# Patient Record
Sex: Female | Born: 1970 | Race: White | Hispanic: No | Smoking: Never smoker
Health system: Southern US, Community
[De-identification: ages and names within clinical notes are randomized; demographics above are authoritative.]

## PROBLEM LIST (undated history)

## (undated) DIAGNOSIS — T7840XA Allergy, unspecified, initial encounter: Secondary | ICD-10-CM

## (undated) DIAGNOSIS — F419 Anxiety disorder, unspecified: Secondary | ICD-10-CM

## (undated) DIAGNOSIS — E786 Lipoprotein deficiency: Secondary | ICD-10-CM

## (undated) DIAGNOSIS — L709 Acne, unspecified: Secondary | ICD-10-CM

## (undated) DIAGNOSIS — N200 Calculus of kidney: Secondary | ICD-10-CM

## (undated) DIAGNOSIS — E669 Obesity, unspecified: Secondary | ICD-10-CM

## (undated) DIAGNOSIS — N1330 Unspecified hydronephrosis: Secondary | ICD-10-CM

## (undated) HISTORY — DX: Calculus of kidney: N20.0

## (undated) HISTORY — DX: Unspecified hydronephrosis: N13.30

## (undated) HISTORY — DX: Acne, unspecified: L70.9

## (undated) HISTORY — DX: Lipoprotein deficiency: E78.6

## (undated) HISTORY — DX: Allergy, unspecified, initial encounter: T78.40XA

## (undated) HISTORY — DX: Obesity, unspecified: E66.9

## (undated) HISTORY — DX: Anxiety disorder, unspecified: F41.9

---

## 1998-12-06 ENCOUNTER — Other Ambulatory Visit: Admission: RE | Admit: 1998-12-06 | Discharge: 1998-12-06 | Payer: Self-pay | Admitting: Obstetrics and Gynecology

## 1999-05-30 ENCOUNTER — Emergency Department (HOSPITAL_COMMUNITY): Admission: EM | Admit: 1999-05-30 | Discharge: 1999-05-30 | Payer: Self-pay | Admitting: Emergency Medicine

## 1999-09-08 ENCOUNTER — Emergency Department (HOSPITAL_COMMUNITY): Admission: EM | Admit: 1999-09-08 | Discharge: 1999-09-08 | Payer: Self-pay | Admitting: Emergency Medicine

## 2000-02-27 ENCOUNTER — Other Ambulatory Visit: Admission: RE | Admit: 2000-02-27 | Discharge: 2000-02-27 | Payer: Self-pay | Admitting: Obstetrics and Gynecology

## 2001-04-23 ENCOUNTER — Other Ambulatory Visit: Admission: RE | Admit: 2001-04-23 | Discharge: 2001-04-23 | Payer: Self-pay | Admitting: Obstetrics and Gynecology

## 2002-04-08 ENCOUNTER — Other Ambulatory Visit: Admission: RE | Admit: 2002-04-08 | Discharge: 2002-04-08 | Payer: Self-pay | Admitting: Obstetrics and Gynecology

## 2002-06-29 ENCOUNTER — Encounter: Payer: Self-pay | Admitting: Obstetrics and Gynecology

## 2002-06-29 ENCOUNTER — Ambulatory Visit (HOSPITAL_COMMUNITY): Admission: RE | Admit: 2002-06-29 | Discharge: 2002-06-29 | Payer: Self-pay | Admitting: Obstetrics and Gynecology

## 2002-11-18 ENCOUNTER — Inpatient Hospital Stay (HOSPITAL_COMMUNITY): Admission: AD | Admit: 2002-11-18 | Discharge: 2002-11-22 | Payer: Self-pay | Admitting: Obstetrics and Gynecology

## 2002-11-24 ENCOUNTER — Inpatient Hospital Stay (HOSPITAL_COMMUNITY): Admission: AD | Admit: 2002-11-24 | Discharge: 2002-11-24 | Payer: Self-pay | Admitting: Obstetrics and Gynecology

## 2005-05-07 ENCOUNTER — Other Ambulatory Visit: Admission: RE | Admit: 2005-05-07 | Discharge: 2005-05-07 | Payer: Self-pay | Admitting: Family Medicine

## 2006-03-17 ENCOUNTER — Ambulatory Visit: Payer: Self-pay | Admitting: Family Medicine

## 2006-03-27 ENCOUNTER — Ambulatory Visit: Payer: Self-pay | Admitting: Family Medicine

## 2006-11-12 ENCOUNTER — Ambulatory Visit: Payer: Self-pay | Admitting: Family Medicine

## 2006-11-18 ENCOUNTER — Ambulatory Visit: Payer: Self-pay | Admitting: Family Medicine

## 2007-01-07 ENCOUNTER — Other Ambulatory Visit: Admission: RE | Admit: 2007-01-07 | Discharge: 2007-01-07 | Payer: Self-pay | Admitting: Family Medicine

## 2007-01-07 ENCOUNTER — Ambulatory Visit: Payer: Self-pay | Admitting: Family Medicine

## 2008-01-25 ENCOUNTER — Ambulatory Visit: Payer: Self-pay | Admitting: Family Medicine

## 2008-08-08 ENCOUNTER — Ambulatory Visit: Payer: Self-pay | Admitting: Family Medicine

## 2008-10-17 ENCOUNTER — Ambulatory Visit: Payer: Self-pay | Admitting: Family Medicine

## 2009-11-08 ENCOUNTER — Ambulatory Visit: Payer: Self-pay | Admitting: Physician Assistant

## 2010-09-16 HISTORY — PX: LITHOTRIPSY: SUR834

## 2010-10-05 DIAGNOSIS — N1330 Unspecified hydronephrosis: Secondary | ICD-10-CM

## 2010-10-05 HISTORY — DX: Unspecified hydronephrosis: N13.30

## 2010-10-15 ENCOUNTER — Ambulatory Visit (HOSPITAL_COMMUNITY): Admission: RE | Admit: 2010-10-15 | Payer: BC Managed Care – PPO | Source: Ambulatory Visit | Admitting: Urology

## 2010-11-02 NOTE — Op Note (Signed)
NAME:  Denise Fisher, Denise Fisher                         ACCOUNT NO.:  [id number]   MEDICAL RECORD NO.:  [id number]                   PATIENT TYPE:  INP   LOCATION:  9118                                 FACILITY:  [facility]   PHYSICIAN:  Hal Morales, M.D.             DATE OF BIRTH:  December 25, 1970   DATE OF PROCEDURE:  11/19/2002  DATE OF DISCHARGE:                                 OPERATIVE REPORT   PREOPERATIVE DIAGNOSES:  1. Intrauterine pregnancy at term.  2. Blood pressure elevation.  3. Failure to progress in labor.   POSTOPERATIVE DIAGNOSES:  1. Intrauterine pregnancy at term.  2. Blood pressure elevation.  3. Failure to progress in labor.  4. Large for gestational age baby.   OPERATION:  Primary low transverse cesarean section.   SURGEON:  Hal Morales, M.D.   FIRST ASSISTANT:  Renaldo Reel. Emilee Hero, C.N.M.   ANESTHESIA:  Epidural.   ESTIMATED BLOOD LOSS:  750 mL.   COMPLICATIONS:  None.   FINDINGS:  The patient was delivered of a female infant whose name is Aiden,  weighing 10 pounds, with Apgars of 8 and 9 at one and five minutes,  respectively.  The uterus, tubes, and ovaries were normal for the gravid  state.   DESCRIPTION OF PROCEDURE:  The patient was taken to the operating room after  a discussion had been held with her and her husband concerning the  indications for her procedure and the risks involved.  The patient had  failed to progress with adequate labor over several hours beyond 5 cm.  The  risks of anesthesia, bleeding, infection, and damage to adjacent organs were  all explained and the patient and husband consented to proceed.  She was  taken to the operating room and placed on the operating table.  Her labor  epidural was dosed.  Her Foley catheter was already in placed.  The abdomen  was prepped with multiple layers of Betadine and draped as a sterile field.  After assurance of an adequate anesthesia, the suprapubic region was  infiltrated with 10 mL of  0.25% Marcaine.  A suprapubic incision was made  and the abdomen opened in layers.  Peritoneum was entered and the bladder  blade placed.  The uterus was incised approximately 2 cm above the  uterovesical fold and the incision taken laterally on either side bluntly.  The infant was delivered from the occiput transverse position and after  having the nares and pharynx suctioned with DeLee suction and the cord  clamped and cut, was handed off to the awaiting pediatricians.  The  appropriate cord blood was drawn and the placenta noted to have separated  from the uterus and was removed from the operative field.  The uterine  incision was closed with a running interlocking suture of 0 Vicryl.  An  imbricating suture of 0 Vicryl was placed.  Copious irrigation was carried  out and  hemostasis noted.  The abdominal peritoneum was closed with running  suture of 2-0 Vicryl.  The rectus muscles were reapproximated in the midline  with a figure-of-eight suture of 2-0 Vicryl.  The rectus muscles were made  hemostatic with Bovie cautery and irrigated.  The rectus fascia was closed  with a running suture of 0 Vicryl and reinforced on either side of midline  with figure-of-eight sutures of 0 Vicryl.  The subcutaneous tissue was  copiously irrigated and made hemostatic with Bovie cautery.  Skin staples  were applied to the skin incision.  A sterile dressing was applied.  The  patient was taken from the operating room to the recovery room in  satisfactory condition, having tolerated the procedure well with sponge and  instrument counts correct.  The infant went to the full-term nursery.                                               Hal Morales, M.D.    VPH/MEDQ  D:  11/19/2002  T:  11/19/2002  Job:  [id number]

## 2010-11-02 NOTE — Discharge Summary (Signed)
NAME:  Denise Fisher, Denise Fisher                         ACCOUNT NO.:  [id number]   MEDICAL RECORD NO.:  [id number]                   PATIENT TYPE:  INP   LOCATION:  9118                                 FACILITY:  [facility]   PHYSICIAN:  Crist Fat. Rivard, M.D.              DATE OF BIRTH:  August 12, 1970   DATE OF ADMISSION:  11/18/2002  DATE OF DISCHARGE:  11/22/2002                                 DISCHARGE SUMMARY   ADMISSION DIAGNOSES:  1. Intrauterine pregnancy at 41-2/7 weeks.  2. Labile blood pressures.  3. Favorable cervix.   DISCHARGE DIAGNOSES:  1. Intrauterine pregnancy at term.  2. Blood pressure elevations.  3. Failure to progress.  4. Large for gestational age.  5. Elevated liver function tests.   PROCEDURES:  1. Primary low transverse cesarean section.  2. Failed induction.   HOSPITAL COURSE:  Ms. Klahr is a 40 year old, G1, P0 at 40-2/7 weeks who  was admitted for induction on November 18, 2002, secondary to labile blood  pressures.  Blood pressures in the office have been elevated in the 150/90  range and 140/100 for the last two to three visits.  However, the patient  was monitoring her blood pressures at home with a calibrated machine and  they were within normal limits.  She denied any other problems.  On  admission, she was 3 cm dilated.  Her pregnancy has been remarkable for long  cycles, family history of gestational diabetes and labile blood pressures on  office visits.  Pitocin was begun.  The patient was already contracting  some.  Labs were checked and she had elevated SGOT and SGPT noted with SGOT  60 and SGPT 126.  All other labs were within normal limits and her blood  pressures were 130/80.  She progressed to 5 cm, but had no progression  beyond that despite adequate labor.  She was consented for a C-section.  The  patient was taken to the operating room where a primary low transverse  cesarean section was performed for a viable female by the name of Aiden,  weight 10  pounds, Apgar's 8 and 9.  The patient tolerated the procedure well  and was taken to the recovery room in good condition.  The infant was taken  to the full-term nursery.  Postop labs on the same day showed her SGOT 60,  SGPT 129 and other labs within normal limits.  Labs were repeated the next  day with still mildly elevated SGOT of 65 and SGPT 113.  Blood pressures  were normal.  Hemoglobin was 9.6 and platelets were 215.  Her weight that  day was 217 pounds which was down 11 pounds from weight on admission.  By  postop day #3, the patient was doing well.  She was still 9 pounds less than  she had been on admission.  Her physical exam was within normal limits.  Her  incision was  clean, dry and intact.  She was breast-feeding.  She was  deferring contraception until six weeks postpartum.  She was deemed to have  received full benefit of her hospital stay.  The plan was made to follow up  with repeat liver function tests in one week.  She was discharged home.   SPECIAL INSTRUCTIONS:  Discharge instructions per [facility]  handout.   DISCHARGE MEDICATIONS:  1. Motrin 600 mg p.o. q.6h. p.r.n. pain.  2. Tylox one to two p.o. q.3-4h. p.r.n. pain.   FOLLOW UP:  Will occur in one week at [facility] for liver  function test repeat and then office visit six weeks postpartum as usual.      Chip Boer L. Emilee Hero, C.N.M.                   Crist Fat Rivard, M.D.    Leeanne Mannan  D:  11/22/2002  T:  11/22/2002  Job:  [id number]

## 2010-11-02 NOTE — H&P (Signed)
NAME:  SHADAYA, MARSCHNER                         ACCOUNT NO.:  [id number]   MEDICAL RECORD NO.:  [id number]                   PATIENT TYPE:  MAT   LOCATION:  [facility]                                 FACILITY:  [facility]   PHYSICIAN:  Hal Morales, M.D.             DATE OF BIRTH:  1971/01/20   DATE OF ADMISSION:  11/18/2002  DATE OF DISCHARGE:                                HISTORY & PHYSICAL   Ms. Camilo is a 40 year old gravida 1, para 0 at 41-2/7 weeks who presents  for induction secondary to labile blood pressures. She has had blood  pressures on her office visits of 150/90 and 140/100 today. Her blood  pressures have been within normal limits on her home monitor. She has been  monitoring her blood pressures throughout most of her pregnancy secondary to  her tendency to have elevated blood pressures at the office but normal blood  pressures away from the office. She denies any headache, visual symptoms,  epigastric pain. Her urine was negative for protein at the office. Cervix  was 3 cm, 75%, -1 vertex on exam in the office.   Pregnancy has been remarkable for:  1. Mild fetal renal pyelectasis.  2. Long cycles.  3. Family history of gestational diabetes.  4. Labile blood pressures on office visits.    PRENATAL LABORATORY DATA:  Blood type is O+, Rh antibody negative, VDRL  nonreactive, rubella titer positive, hepatitic B surface antigen negative,  HIV nonreactive, GC and Chlamydia cultures were negative. Pap was normal.  Glucose challenge was normal. AFP was normal. Cystic fibrosis testing was  negative. Group B strep culture and other cultures were negative at 36  weeks. Hemoglobin upon entering the practice was 11.7. It was 11.2 at 28  weeks. EDC of 11/09/02 was established by ultrasound in the early second  trimester secondary to long cycles.   HISTORY OF PRESENT PREGNANCY:  The patient entered care at approximately  seven weeks. She had an ultrasound at 17 weeks for dating  purposes. There  was a right choroid plexus cyst and right fetal renal pyelectasis noted. She  also had an abnormal AFP. Her EDC was changed, and therefore the AFP was  recalculated as normal. She had some bright red bleeding at 23 weeks. She  was seen in the office and had an approximately 1-cm skin split on the right  inner labia. This was made hemostasis with small amount of silver nitrate.  This did resolve itself without difficulty. She had some cough at 28 weeks.  At approximately 30 weeks, she began to show some elevation of her blood  pressure at the office and was asked to begin to check her blood pressures  at home secondary to possible questionable white-coat hypertension syndrome.  Her pressures in the office continued to be 150/76, 150/90, with today's  blood pressure 140/100 on two evaluations; however, on her recounting of  home blood pressures,  they were all normal. Her machine was also calibrated  to office machines. She was noted to be 3 cm in the office today; therefore,  was admitted for induction secondary to labile blood pressures and favorable  cervix.   OBSTETRICAL HISTORY:  The patient is a primigravida.   MEDICAL HISTORY:  She is a previous condom user. She has occasional yeast  infections. She had frequent UTIs as a child. She had a cyst removal from  her back in 1994. She is sensitive to red dye.   FAMILY HISTORY:  Her mother and brother has asthma. Her mother has insulin-  dependent diabetes. Maternal grandmother had breast cancer, and paternal  grandmother had lung and brain cancer.   GENETIC HISTORY:  Unremarkable.   SOCIAL HISTORY:  The patient is married to the father of the baby. He is  involved and supportive. His name is Hoyt Koch. The patient is college  educated. 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 Her husband has  four years of college. He is employed at a [employer]. She is Caucasian in  ethnicity. She has been followed by the  certified nurse midwife service at  [facility]. She denies any alcohol, drug, or tobacco use during  this pregnancy.   PHYSICAL EXAMINATION:  VITAL SIGNS:  Blood pressure are 156/102, 144/96, and  127/88 in maternity admissions. Other vital signs are stable.  HEENT:  Within normal limits.  LUNGS:  Bilateral breath sounds are clear.  HEART:  Regular rate and rhythm without murmur.  BREASTS:  Soft and nontender.  ABDOMEN:  Fundal height is approximately 39 cm. Estimated fetal weight is 7  to 7.5 pounds. Uterine contractions are every four minutes, mild quality.  Cervical exam by this examiner is 3 cm, 75% , vertex, and -2 station.  EXTREMITIES:  Deep tendon reflexes are 1+ without clonus. There is a trace  edema noted.   Urine is negative for protein from the office.   IMPRESSION:  1. Intrauterine pregnancy at 41-2/7 weeks.  2. Labile blood pressures.  3. Favorable cervix.   PLAN:  1. Admit to birthing suite with consult with Dr. Dierdre Forth, attending     physician.  2. Routine certified nurse midwife orders.  3. Plan PIH labs with routine labs.  4. Pitocin for induction for augmentation of labor process.  5. Close observation of maternal blood pressures.     Renaldo Reel Emilee Hero, C.N.M.                   Hal Morales, M.D.    Leeanne Mannan  D:  11/18/2002  T:  11/18/2002  Job:  [id number]

## 2010-11-09 ENCOUNTER — Inpatient Hospital Stay (HOSPITAL_COMMUNITY)
Admission: RE | Admit: 2010-11-09 | Discharge: 2010-11-09 | Disposition: A | Discharge status: Home / Self Care | Payer: BC Managed Care – PPO | Source: Ambulatory Visit | Attending: Family Medicine | Admitting: Family Medicine

## 2011-02-16 ENCOUNTER — Emergency Department (HOSPITAL_COMMUNITY)
Admission: EM | Admit: 2011-02-16 | Discharge: 2011-02-16 | Disposition: A | Discharge status: Home / Self Care | Payer: BC Managed Care – PPO | Attending: Emergency Medicine | Admitting: Emergency Medicine

## 2011-02-16 ENCOUNTER — Emergency Department (HOSPITAL_COMMUNITY): Payer: BC Managed Care – PPO

## 2011-02-16 DIAGNOSIS — N200 Calculus of kidney: Secondary | ICD-10-CM | POA: Insufficient documentation

## 2011-02-16 DIAGNOSIS — R109 Unspecified abdominal pain: Secondary | ICD-10-CM | POA: Insufficient documentation

## 2011-02-16 DIAGNOSIS — Z87442 Personal history of urinary calculi: Secondary | ICD-10-CM | POA: Insufficient documentation

## 2011-02-16 DIAGNOSIS — R112 Nausea with vomiting, unspecified: Secondary | ICD-10-CM | POA: Insufficient documentation

## 2011-02-25 ENCOUNTER — Ambulatory Visit (HOSPITAL_COMMUNITY)
Admission: RE | Admit: 2011-02-25 | Discharge: 2011-02-25 | Disposition: A | Discharge status: Home / Self Care | Payer: BC Managed Care – PPO | Source: Ambulatory Visit | Attending: Urology | Admitting: Urology

## 2011-02-25 DIAGNOSIS — E669 Obesity, unspecified: Secondary | ICD-10-CM | POA: Insufficient documentation

## 2011-02-25 DIAGNOSIS — N201 Calculus of ureter: Secondary | ICD-10-CM | POA: Insufficient documentation

## 2011-02-25 LAB — PREGNANCY, URINE: Preg Test, Ur: NEGATIVE

## 2011-02-27 ENCOUNTER — Encounter: Payer: Self-pay | Admitting: Medical

## 2011-02-27 ENCOUNTER — Ambulatory Visit (INDEPENDENT_AMBULATORY_CARE_PROVIDER_SITE_OTHER): Payer: BC Managed Care – PPO | Admitting: Medical

## 2011-02-27 ENCOUNTER — Encounter: Payer: Self-pay | Admitting: Family Medicine

## 2011-02-27 VITALS — BP 150/90 | HR 88 | Temp 98.0°F | Resp 20 | Ht 64.0 in | Wt 238.0 lb

## 2011-02-27 DIAGNOSIS — F419 Anxiety disorder, unspecified: Secondary | ICD-10-CM

## 2011-02-27 DIAGNOSIS — R030 Elevated blood-pressure reading, without diagnosis of hypertension: Secondary | ICD-10-CM

## 2011-02-27 DIAGNOSIS — R059 Cough, unspecified: Secondary | ICD-10-CM

## 2011-02-27 DIAGNOSIS — R05 Cough: Secondary | ICD-10-CM

## 2011-02-27 DIAGNOSIS — F411 Generalized anxiety disorder: Secondary | ICD-10-CM

## 2011-02-27 DIAGNOSIS — R0982 Postnasal drip: Secondary | ICD-10-CM

## 2011-02-27 MED ORDER — ALPRAZOLAM 0.25 MG PO TABS: 0.2500 mg | ORAL_TABLET | Freq: Three times a day (TID) | ORAL | Status: AC | PRN

## 2011-02-27 NOTE — Progress Notes (Signed)
Subjective:   HPI  Denise Fisher is a 40 y.o. female who presents for cough.  She notes 1 week hx/o cough, post nasal drip, mild sinus congestion, clear sputum, fatigue, but cough is keeping her up at night.   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   Otherwise feels fine.  She had been on Zyrtec recently, but stopped all her medication few days prior to surgery/lithotripsy for recent renal stone.  She didn't cough any while in hospital on pain medication.  She requests refill on xanax.  Last refill over 1 year ago.  She uses this rarely for bad anxiety, but most of the time she can control her anxiety with deep breathing and through non medication measures.   [facility]  No other c/o.  The following portions of the patient's history were reviewed and updated as appropriate: allergies, current medications, past family history, past medical history, past social history, past surgical history and problem list.  Past Medical History  Diagnosis Date  . Allergy     RHINTIS  . Anxiety   . Acne   . Low HDL (under 40)   . Obesity   . Hydronephrosis 10/05/2010  . Renal stone      Review of Systems Constitutional: denies fever, chills, sweats, unexpected weight change, anorexia, fatigue Allergy: +congestion, no sneezing Dermatology: denies rash ENT: no runny nose, ear pain, sore throat, hoarseness, sinus pain Cardiology: denies chest pain, palpitations, edema Respiratory: denies shortness of breath, wheezing Gastroenterology: denies abdominal pain, nausea, vomiting, diarrhea, constipation Musculoskeletal: denies arthralgias, myalgias, joint swelling, back pain Ophthalmology: denies vision changes Urology: denies dysuria, difficulty urinating, hematuria, urinary frequency, urgency Neurology: no headache, weakness, tingling, numbness Psychology: denies depressed mood, sleep problems     Objective:   Physical Exam  General  appearance: alert, no distress, WD/WN, white female HEENT: normocephalic, sclerae anicteric, PERRLA, EOMi, nares patent, mild clear discharge and swollen turbinates, pharynx normal Oral cavity: MMM, no lesions Neck: supple, no lymphadenopathy, no thyromegaly, no masses Heart: RRR, normal S1, S2, no murmurs Lungs: CTA bilaterally, no wheezes, rhonchi, or rales Extremities: no edema, no cyanosis, no clubbing Pulses: 2+ symmetric, upper and lower extremities, normal cap refill    Assessment :    Encounter Diagnoses  Name Primary?  . Cough Yes  . Post-nasal drip   . Anxiety   . Elevated blood pressure reading without diagnosis of hypertension       Plan:  Cough - etiology seems to be post nasal drip.  Advised she hydrate well, use nasal saline and salt water gargles, begin back on Zyrtec.  Call if not improving or worse.   Anxiety - discussed measures to deal with anxiety.  Last script written for #30 over a year ago.   Refilled Xanax today.  Elevated BP - recheck BP with nurse in 31mo.

## 2011-04-18 ENCOUNTER — Telehealth: Payer: Self-pay | Admitting: Family Medicine

## 2011-04-18 ENCOUNTER — Telehealth: Payer: Self-pay | Admitting: Medical

## 2011-04-18 NOTE — Telephone Encounter (Signed)
RECORDS FAXED-LM

## 2011-04-18 NOTE — Telephone Encounter (Signed)
DONE

## 2013-07-09 IMAGING — CT CT ABD-PELV W/O CM
2 of 4 series · 17 of 46 positions shown, 19 images · non-contrast
Comparison: None.

CLINICAL DATA: Left flank pain radiating to left lower quadrant.
Nausea and vomiting.  Previous history of kidney stones.

CT ABDOMEN AND PELVIS WITHOUT CONTRAST
TECHNIQUE: Multidetector CT imaging of the abdomen and pelvis was
performed following the standard protocol without intravenous
contrast.

[Series 2: a/p w/o 5.0 b31f st · axial · non-contrast · 0.77mm/px · z∈[+806,+1246]mm · 14 of 96 slices shown, 16 images]
[im 4/96  soft-tissue]
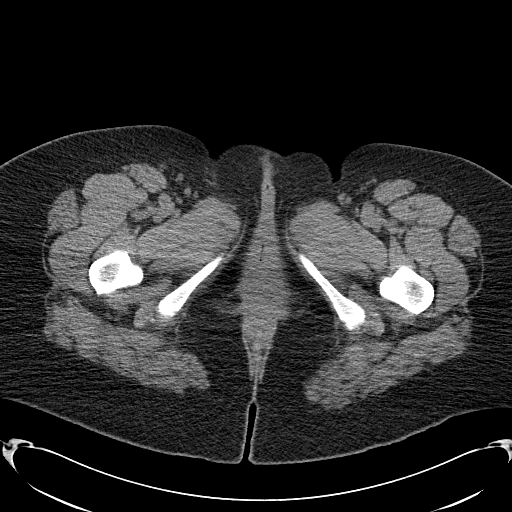
[im 4/96  bone]
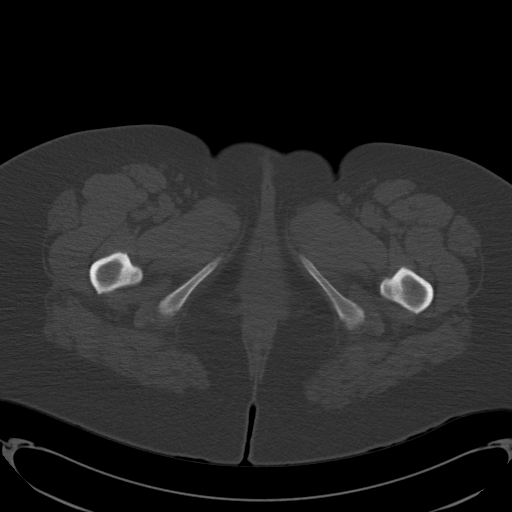
[im 12/96  soft-tissue]
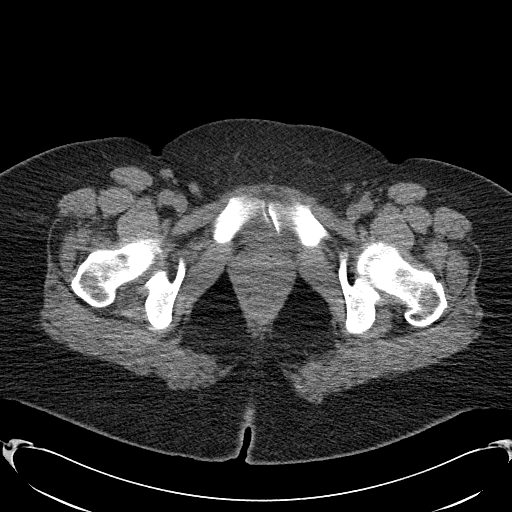
[im 20/96  soft-tissue]
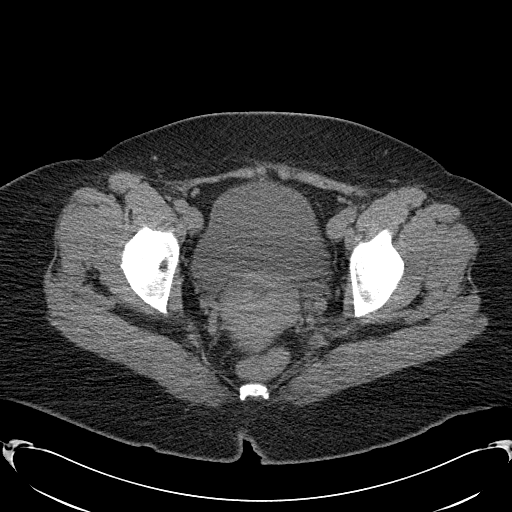
[im 27/96  soft-tissue]
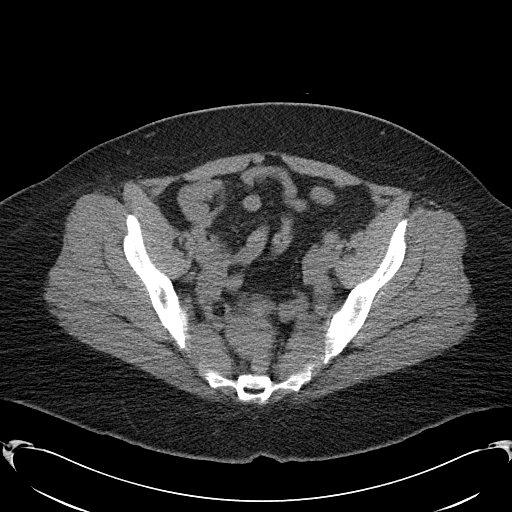
[im 31/96  soft-tissue]
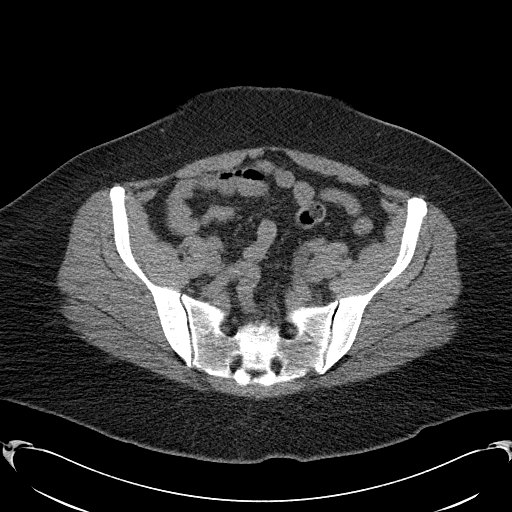
[im 39/96  soft-tissue]
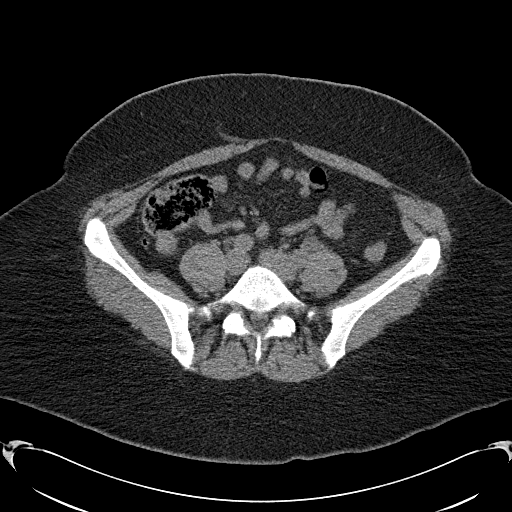
[im 46/96  soft-tissue]
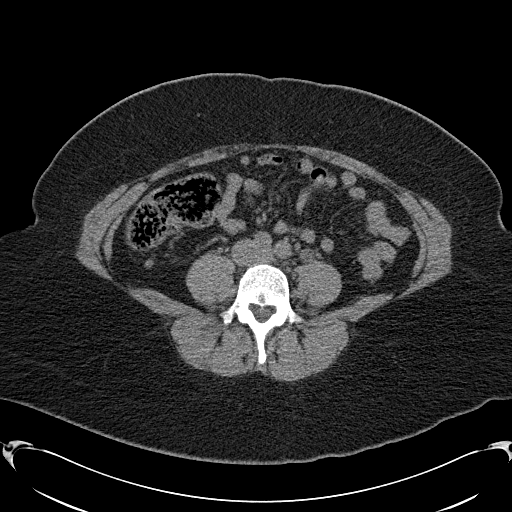
[im 50/96  soft-tissue]
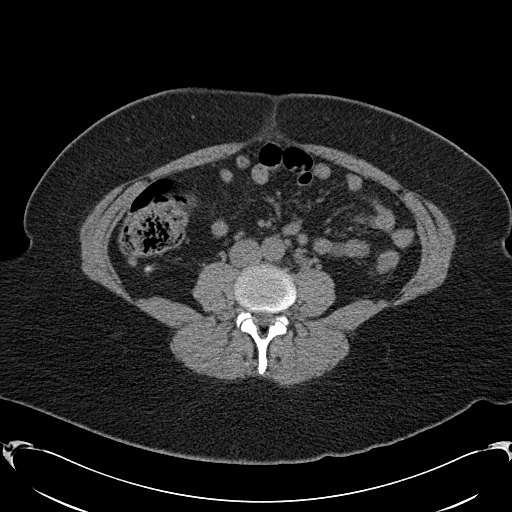
[im 58/96  soft-tissue]
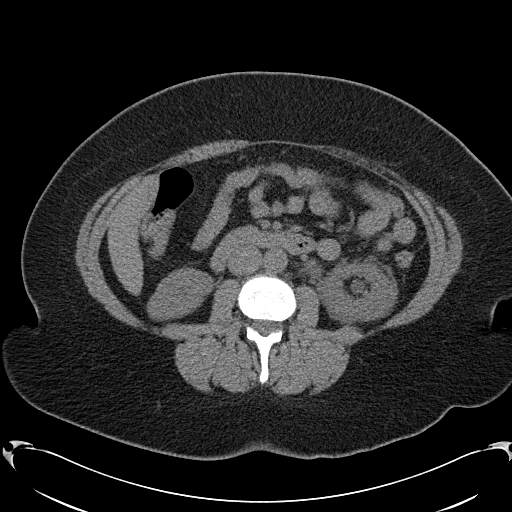
[im 58/96  bone]
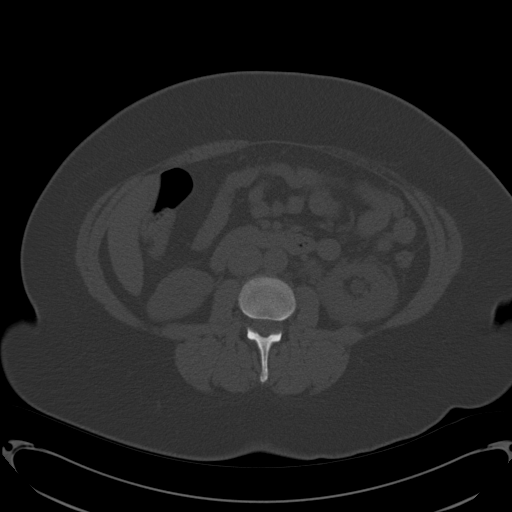
[im 65/96  soft-tissue]
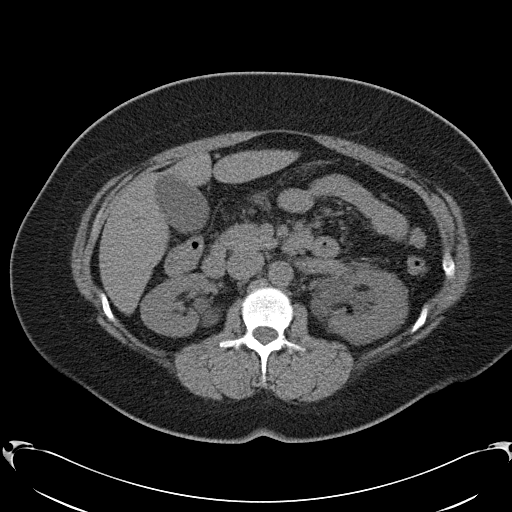
[im 73/96  soft-tissue]
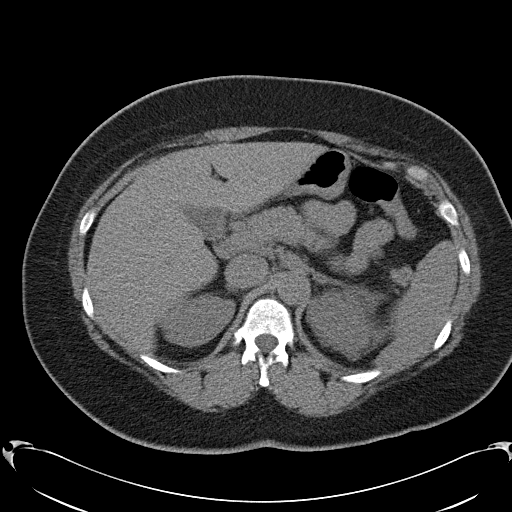
[im 77/96  soft-tissue]
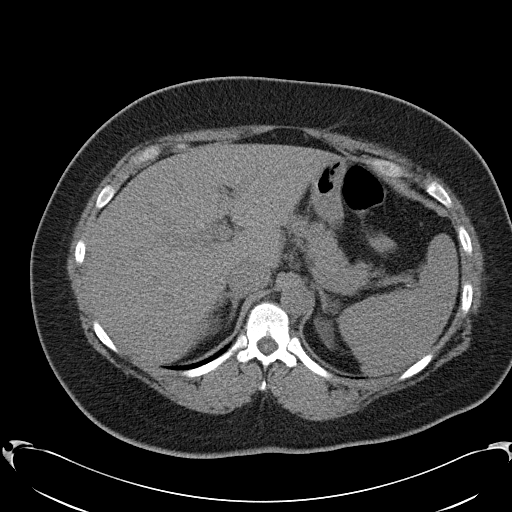
[im 84/96  soft-tissue]
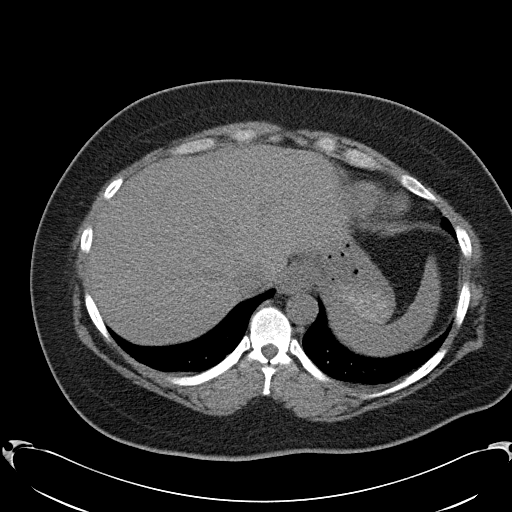
[im 92/96  soft-tissue]
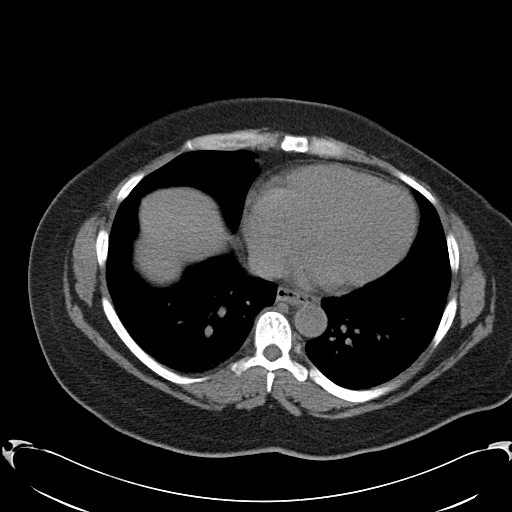

[Series 602: <mpr thick range> · coronal · 0.94mm/px · 3 of 94 slices shown]
[im 32/94  soft-tissue]
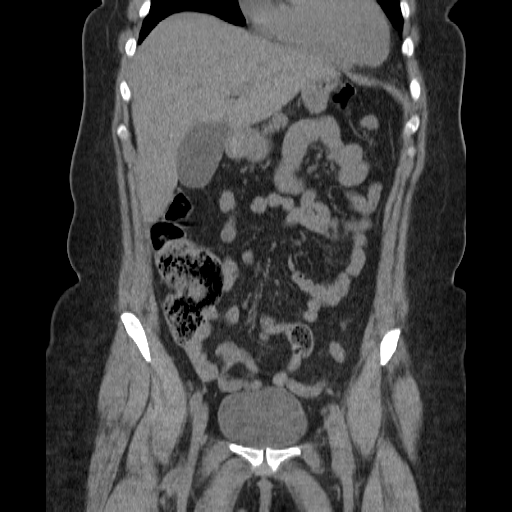
[im 42/94  soft-tissue]
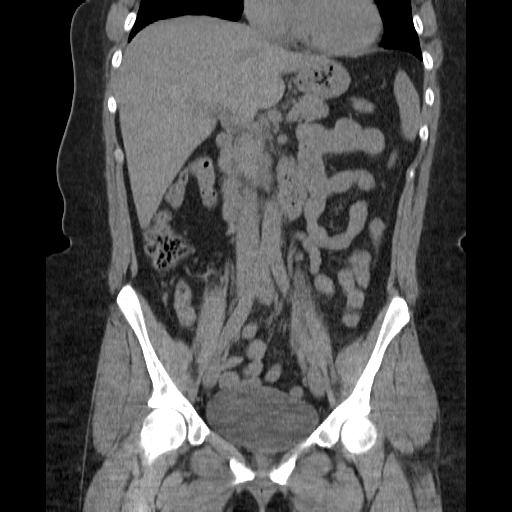
[im 52/94  soft-tissue]
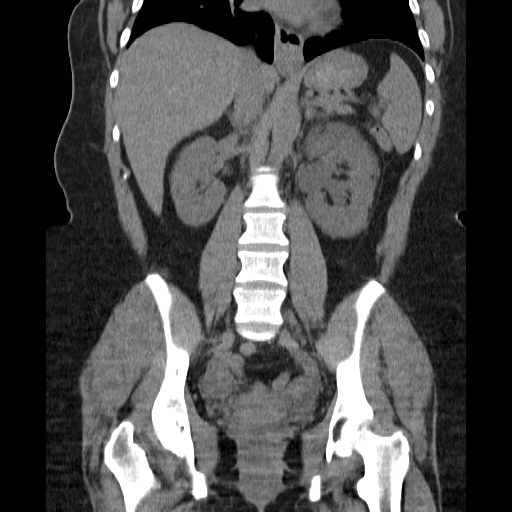

[17 of 46 positions shown; findings below may reference images not displayed]

FINDINGS: Mild dependent atelectasis in the lung bases.

The left kidney is enlarged with pyelocaliectasis and ureterectasis
demonstrated down to the level of the ureterovesicle junction,
where there is a 8 mm stone.  There is periureteral and mild
pararenal stranding.  No pyelocaliectasis or ureterectasis on the
right side.  No right renal or ureteral stones demonstrated.

Unenhanced images of the liver, spleen, gallbladder, pancreas,
adrenal glands, small bowel, abdominal aorta, and retroperitoneal
lymph nodes are unremarkable.  There is a small esophageal hiatal
hernia.  No free fluid or free air in the abdomen.

Pelvis:  The bladder wall is not thickened.  No free or loculated
pelvic fluid collections.  Uterus and adnexal structures are not
enlarged.  The appendix demonstrates normal caliber without
inflammatory change.  There is a small phlebolith in the distal
portion of the appendix.
IMPRESSION: 8 mm moderately obstructing stone in the distal left ureter at the
ureterovesicle junction.  Appendicolith within the otherwise normal
appendix.

## 2013-07-18 IMAGING — CR DG ABDOMEN 1V
2 series · 2 of 2 positions shown · non-contrast
Comparison: CT 02/16/2011

CLINICAL DATA: Preop.  Pre lithotripsy.  Renal stone.

ABDOMEN - 1 VIEW

[t abdomen supine (1 of 2)]
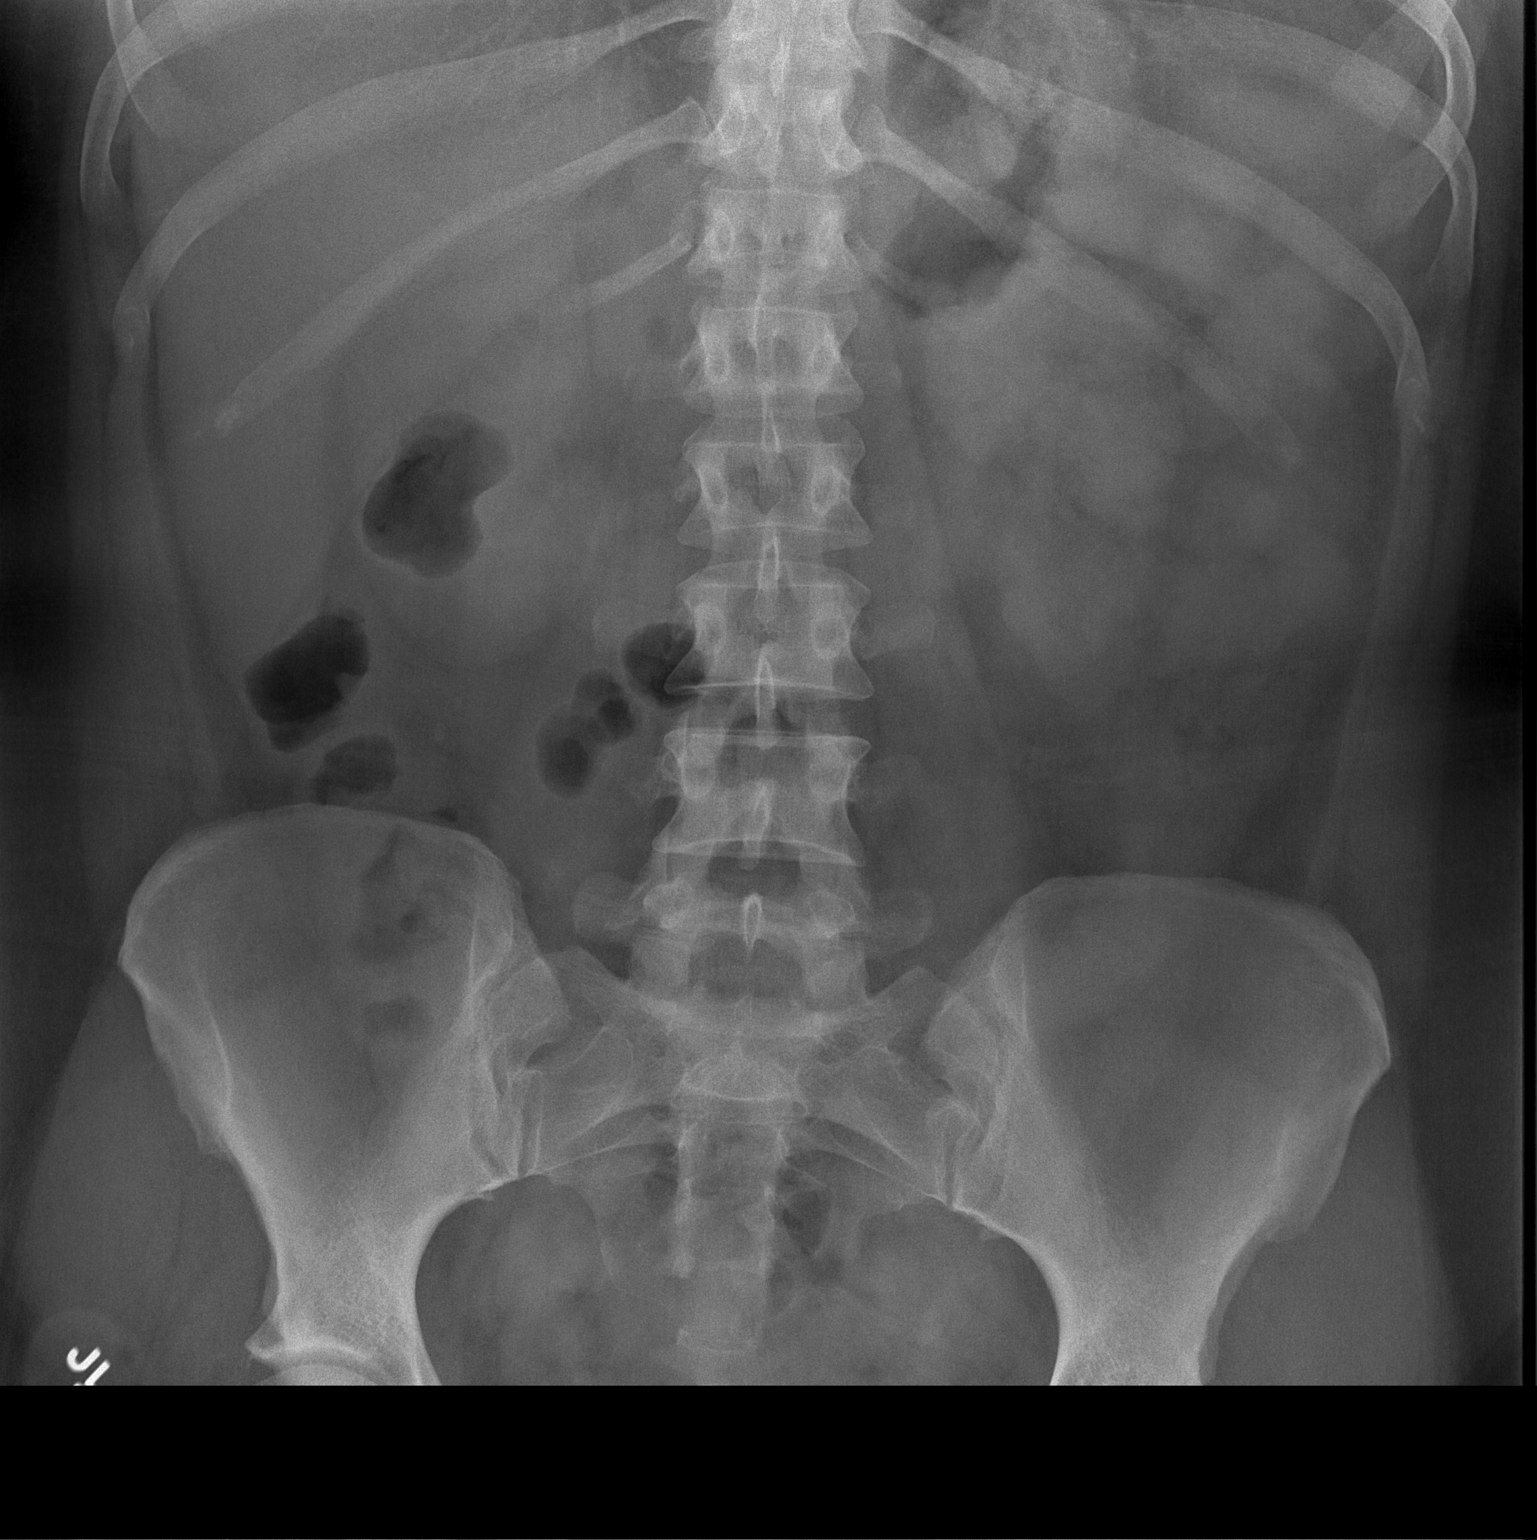

[t abdomen supine (2 of 2)]
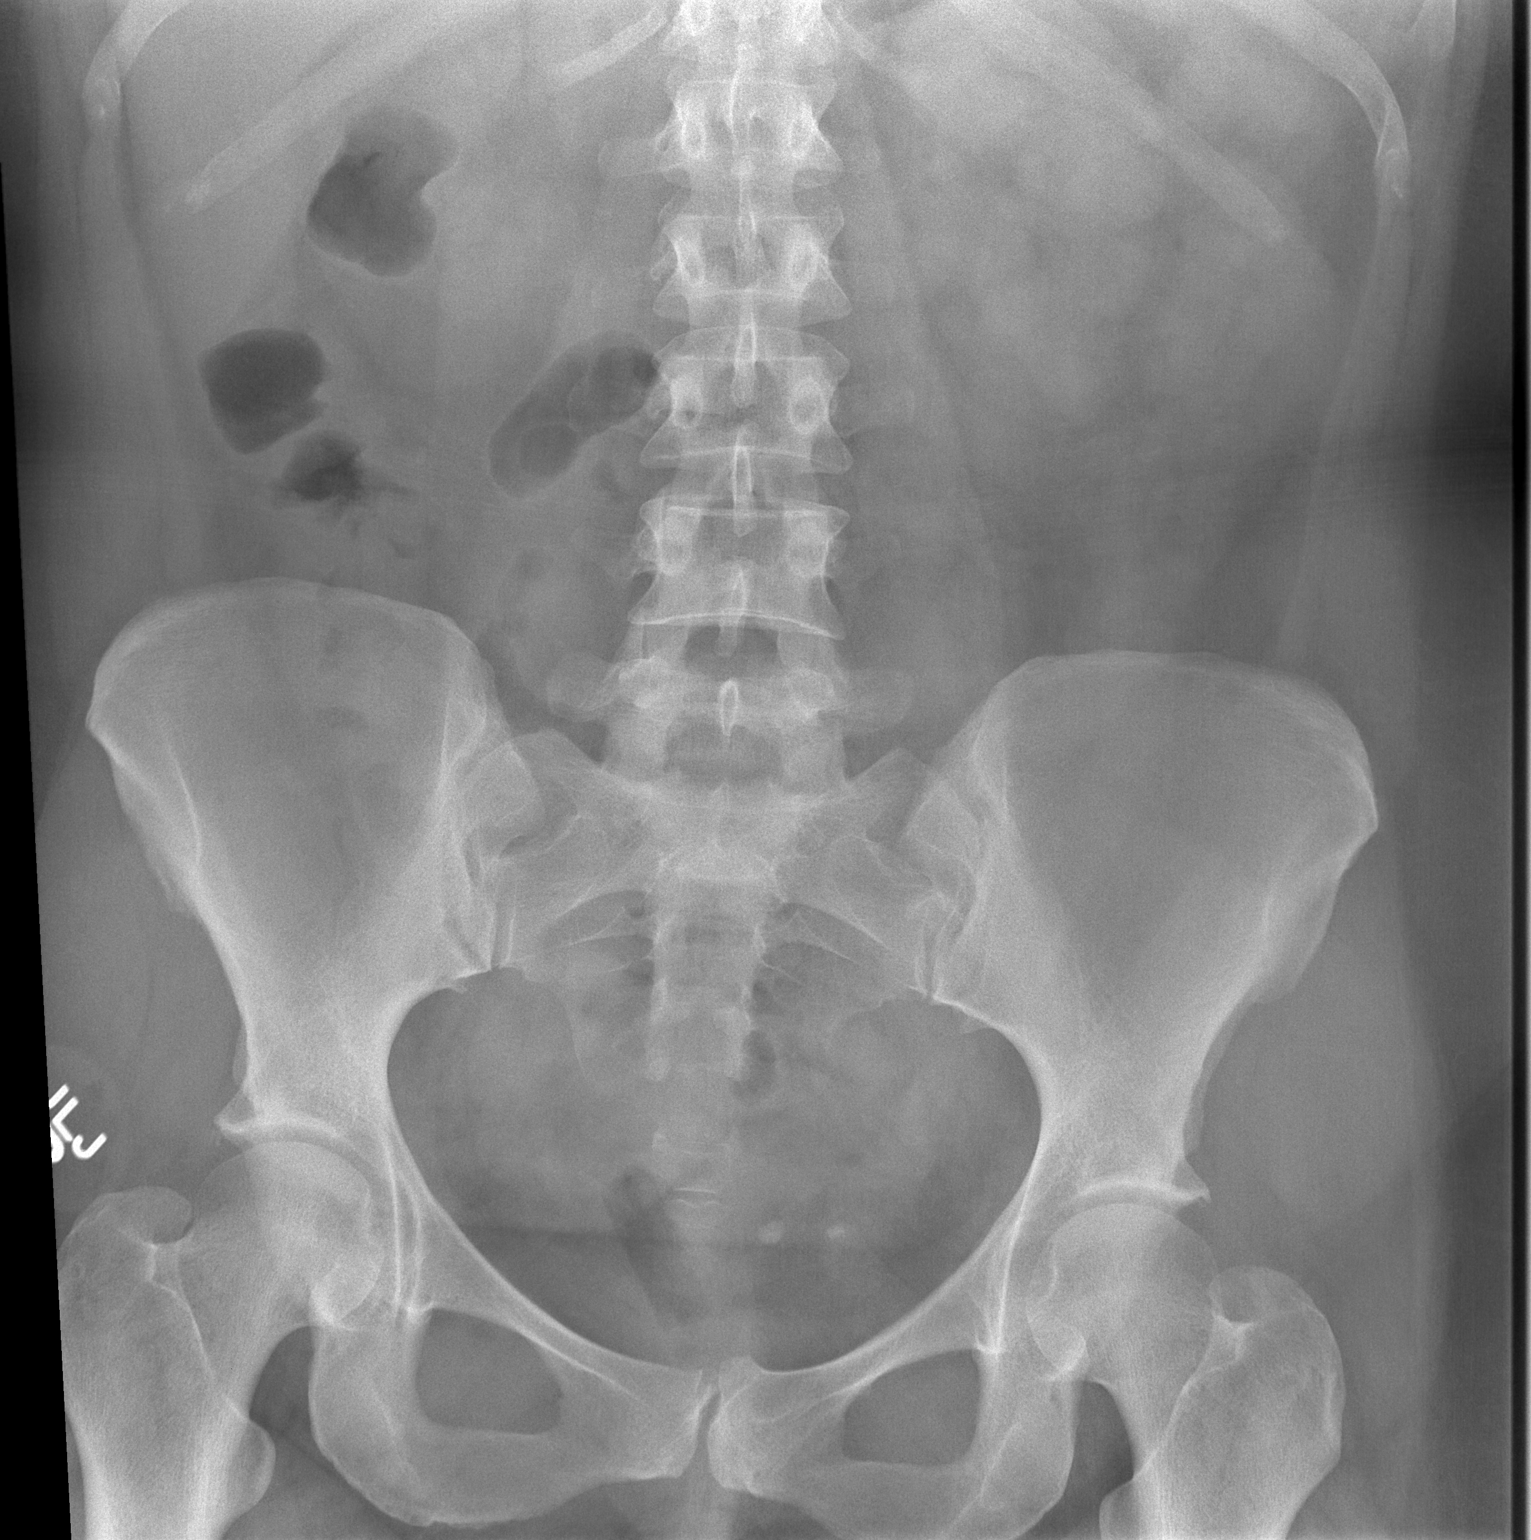

[2 of 2 positions shown; findings below may reference images not displayed]

FINDINGS: Within the left hemi pelvis, there are two
calcifications.  The larger is slightly medial location and
measures 8 mm, correlating well with ureteral vesicle junction
calculus seen on recent CT exam. On today's exam, the
calcifications slightly more medial than on the previous study,
potentially within the bladder on the current study.  More lateral
to this, a smaller calcification is consistent with phlebolith
based on CT and measures approximately 6 mm in diameter.  In the
right mid abdomen, calcification is seen near the colon, consistent
with appendicolith.

Regional bowel gas pattern is nonobstructive. Visualized osseous
structures have a normal appearance.
IMPRESSION: 1.  Persistent 8 mm calculus consistent with ureteral vesicle
junction stone as seen on prior CT exam.  The location of this
appears slightly more medial than it was on CT exam.  Correlation
with the patient's symptoms is suggested.  The stone could
potentially be within the bladder at this time.
2.  Right mid abdominal calcification consistent with
appendicolith.

## 2017-04-06 ENCOUNTER — Encounter (HOSPITAL_COMMUNITY): Payer: Self-pay | Admitting: Emergency Medicine

## 2017-04-06 ENCOUNTER — Ambulatory Visit (HOSPITAL_COMMUNITY)
Admission: EM | Admit: 2017-04-06 | Discharge: 2017-04-06 | Disposition: A | Discharge status: Home / Self Care | Payer: Medicaid Other | Attending: Emergency Medicine | Admitting: Emergency Medicine

## 2017-04-06 DIAGNOSIS — M549 Dorsalgia, unspecified: Secondary | ICD-10-CM | POA: Insufficient documentation

## 2017-04-06 DIAGNOSIS — M545 Low back pain, unspecified: Secondary | ICD-10-CM

## 2017-04-06 LAB — POCT URINALYSIS DIP (DEVICE)
BILIRUBIN URINE: NEGATIVE
Glucose, UA: NEGATIVE mg/dL
Hgb urine dipstick: NEGATIVE
KETONES UR: NEGATIVE mg/dL
NITRITE: NEGATIVE
Protein, ur: NEGATIVE mg/dL
Specific Gravity, Urine: 1.02 (ref 1.005–1.030)
Urobilinogen, UA: 0.2 mg/dL (ref 0.0–1.0)
pH: 5.5 (ref 5.0–8.0)

## 2017-04-06 LAB — POCT PREGNANCY, URINE: PREG TEST UR: NEGATIVE

## 2017-04-06 NOTE — ED Provider Notes (Signed)
[facility]    CSN: [id number] Arrival date & time: 04/06/17  1425     History   Chief Complaint Chief Complaint  Patient presents with  . Back Pain    HPI Denise Fisher is a 46 y.o. female.   Denise Fisher presents with complaints of right back pain which started approximately 3 days ago. She states she has mild fatigue as well. No known fevers. She states she is concerned this is the start of a kidney stone, although denies that the pain is similar to her previous kidney stone. Pain is worse with bending forward. She has had multiple UTI's in the past, but states this also does not feel similar, denies any urinary symptoms. It is achy in nature. Rates it 4/10. No back injury  Known. Denies blood to urine. Has not taken any medication for her symptoms. Has increased her water intake.       Past Medical History:  Diagnosis Date  . Acne   . Allergy    RHINTIS  . Anxiety   . Hydronephrosis 10/05/2010  . Low HDL (under 40)   . Obesity   . Renal stone     There are no active problems to display for this patient.   Past Surgical History:  Procedure Laterality Date  . CESAREAN SECTION    . LITHOTRIPSY  09/2010    OB History    No data available       Home Medications    Prior to Admission medications   Medication Sig Start Date End Date Taking? Authorizing Provider  ALPRAZolam Prudy Feeler(XANAX) 0.5 MG tablet Take 0.5 mg by mouth at bedtime as needed for anxiety.   Yes [provider]  Ascorbic Acid (VITAMIN C) 1000 MG tablet Take 1,000 mg by mouth daily.      [provider]  cetirizine (ZYRTEC) 10 MG tablet Take 10 mg by mouth as needed.      [provider]  HYDROcodone-acetaminophen (NORCO) 10-325 MG per tablet Take 1 tablet by mouth every 6 (six) hours as needed.      [provider]  oxyCODONE-acetaminophen (PERCOCET) 5-325 MG per tablet Take 1 tablet by mouth every 6 (six) hours as needed.      [provider]    Tamsulosin HCl (FLOMAX) 0.4 MG CAPS Take 0.4 mg by mouth daily after supper.      [provider]    Family History Family History  Problem Relation Age of Onset  . Asthma Father   . Asthma Brother   . Diabetes Maternal Grandmother     Social History Social History  Substance Use Topics  . Smoking status: Never Smoker  . Smokeless tobacco: Never Used  . Alcohol use 1.0 oz/week    2 drink(s) per week     Allergies   Sudafed [pseudoephedrine hcl]; Ciprocin-fluocin-procin [fluocinolone acetonide]; Red dye; Penicillins; and Sulfa drugs cross reactors   Review of Systems Review of Systems  Constitutional: Negative.   Respiratory: Negative.   Cardiovascular: Negative.   Gastrointestinal: Negative.   Genitourinary: Negative for decreased urine volume, dysuria, enuresis, frequency, hematuria, pelvic pain, urgency and vaginal bleeding.  Musculoskeletal: Positive for back pain.  Neurological: Negative.      Physical Exam Triage Vital Signs ED Triage Vitals  Enc Vitals Group     BP 04/06/17 1503 136/78     Pulse Rate 04/06/17 1503 88     Resp 04/06/17 1503 16     Temp 04/06/17 1503 98.7  F (37.1 C)     Temp Source 04/06/17 1503 Oral     SpO2 04/06/17 1503 100 %     Weight --      Height --      Head Circumference --      Peak Flow --      Pain Score 04/06/17 1505 4     Pain Loc --      Pain Edu? --      Excl. in GC? --    No data found.   Updated Vital Signs BP 136/78 (BP Location: Right Arm)   Pulse 88   Temp 98.7 F (37.1 C) (Oral)   Resp 16   LMP 03/11/2017   SpO2 100%   Visual Acuity Right Eye Distance:   Left Eye Distance:   Bilateral Distance:    Right Eye Near:   Left Eye Near:    Bilateral Near:     Physical Exam  Constitutional: She is oriented to person, place, and time. She appears well-developed and well-nourished. No distress.  Cardiovascular: Normal rate and regular rhythm.   Pulmonary/Chest: Effort normal and breath  sounds normal.  Abdominal: Soft. There is no tenderness. There is no rigidity, no rebound, no guarding and no CVA tenderness.  Musculoskeletal:       Lumbar back: She exhibits tenderness. She exhibits no bony tenderness.  Reproducible pain with palpation to musculature right of spinous process. Reproducible with flexion   Neurological: She is alert and oriented to person, place, and time.  Skin: Skin is warm and dry.     UC Treatments / Results  Labs (all labs ordered are listed, but only abnormal results are displayed) Labs Reviewed  POCT URINALYSIS DIP (DEVICE) - Abnormal; Notable for the following:       Result Value   Leukocytes, UA SMALL (*)    All other components within normal limits  URINE CULTURE  URINALYSIS, ROUTINE W REFLEX MICROSCOPIC  POCT PREGNANCY, URINE    EKG  EKG Interpretation None       Radiology No results found.  Procedures Procedures (including critical care time)  Medications Ordered in UC Medications - No data to display   Initial Impression / Assessment and Plan / UC Course  I have reviewed the triage vital signs and the nursing notes.  Pertinent labs & imaging results that were available during my care of the patient were reviewed by me and considered in my medical decision making (see chart for details).     Leukocytes to urine, complete urinalysis and culture ordered for follow up at this time. Without urinary symptoms, gi symptoms and with mild reproducible muscular pain low suspicion for kidney stone at this time. Patient agrees. Light activity, push fluids, ibuprofen for pain. If symptoms worsen to return to be seen or to follow up with PCP. Patient agreeable to plan and verbalized understanding. Agreeable to plan.   Final Clinical Impressions(s) / UC Diagnoses   Final diagnoses:  Acute right-sided low back pain without sciatica    New Prescriptions Discharge Medication List as of 04/06/2017  3:46 PM       Controlled  Substance Prescriptions [place] Controlled Substance Registry consulted? Not Applicable   Georgetta Haber, NP 04/06/17 1622

## 2017-04-06 NOTE — Discharge Instructions (Signed)
We will notify you if there are changes to care plan based on your additional urine tests. Light activity, ibuprofen, heat and or ice application, push fluids. If symptoms worsen or develop urinary symptoms please return and/or notify your primary care provider for additional work up.

## 2017-04-06 NOTE — ED Triage Notes (Signed)
Pt c/o constant back pain onset 3 days associated w/fatigue  Reports hx of kidney stones.   Denies fevers, nauseas, vomiting, diarrhea, urinary sx  Reports pain increases w/activity  Denies inj/trauma  A&O x4... NAD... Ambulatory

## 2017-04-07 LAB — URINE CULTURE: CULTURE: NO GROWTH

## 2021-08-27 DIAGNOSIS — B356 Tinea cruris: Secondary | ICD-10-CM | POA: Diagnosis not present

## 2021-08-27 DIAGNOSIS — N898 Other specified noninflammatory disorders of vagina: Secondary | ICD-10-CM | POA: Diagnosis not present

## 2022-03-25 DIAGNOSIS — I10 Essential (primary) hypertension: Secondary | ICD-10-CM | POA: Diagnosis not present

## 2022-03-25 DIAGNOSIS — Z23 Encounter for immunization: Secondary | ICD-10-CM | POA: Diagnosis not present

## 2022-03-25 DIAGNOSIS — E781 Pure hyperglyceridemia: Secondary | ICD-10-CM | POA: Diagnosis not present

## 2022-10-28 DIAGNOSIS — I10 Essential (primary) hypertension: Secondary | ICD-10-CM | POA: Diagnosis not present

## 2022-10-28 DIAGNOSIS — R062 Wheezing: Secondary | ICD-10-CM | POA: Diagnosis not present

## 2022-10-28 DIAGNOSIS — R051 Acute cough: Secondary | ICD-10-CM | POA: Diagnosis not present

## 2022-10-28 DIAGNOSIS — J0100 Acute maxillary sinusitis, unspecified: Secondary | ICD-10-CM | POA: Diagnosis not present

## 2022-11-06 DIAGNOSIS — R051 Acute cough: Secondary | ICD-10-CM | POA: Diagnosis not present

## 2022-11-06 DIAGNOSIS — I10 Essential (primary) hypertension: Secondary | ICD-10-CM | POA: Diagnosis not present

## 2022-12-18 DIAGNOSIS — Z1211 Encounter for screening for malignant neoplasm of colon: Secondary | ICD-10-CM | POA: Diagnosis not present

## 2022-12-18 DIAGNOSIS — Z6841 Body Mass Index (BMI) 40.0 and over, adult: Secondary | ICD-10-CM | POA: Diagnosis not present

## 2022-12-18 DIAGNOSIS — I10 Essential (primary) hypertension: Secondary | ICD-10-CM | POA: Diagnosis not present

## 2022-12-23 DIAGNOSIS — Z1211 Encounter for screening for malignant neoplasm of colon: Secondary | ICD-10-CM | POA: Diagnosis not present

## 2022-12-27 LAB — COLOGUARD: COLOGUARD: NEGATIVE

## 2022-12-30 ENCOUNTER — Ambulatory Visit (HOSPITAL_COMMUNITY)
Admission: EM | Admit: 2022-12-30 | Discharge: 2022-12-30 | Disposition: A | Discharge status: Home / Self Care | Payer: 59 | Attending: Family Medicine | Admitting: Family Medicine

## 2022-12-30 ENCOUNTER — Encounter (HOSPITAL_COMMUNITY): Payer: Self-pay

## 2022-12-30 DIAGNOSIS — R1031 Right lower quadrant pain: Secondary | ICD-10-CM | POA: Diagnosis not present

## 2022-12-30 LAB — COMPREHENSIVE METABOLIC PANEL
ALT: 18 U/L (ref 0–44)
AST: 18 U/L (ref 15–41)
Albumin: 3.6 g/dL (ref 3.5–5.0)
Alkaline Phosphatase: 56 U/L (ref 38–126)
Anion gap: 10 (ref 5–15)
BUN: 16 mg/dL (ref 6–20)
CO2: 24 mmol/L (ref 22–32)
Calcium: 9.2 mg/dL (ref 8.9–10.3)
Chloride: 103 mmol/L (ref 98–111)
Creatinine, Ser: 0.71 mg/dL (ref 0.44–1.00)
GFR, Estimated: >60 mL/min (ref >60)
Glucose, Bld: 85 mg/dL (ref 70–99)
Potassium: 4.1 mmol/L (ref 3.5–5.1)
Sodium: 137 mmol/L (ref 135–145)
Total Bilirubin: 0.4 mg/dL (ref 0.3–1.2)
Total Protein: 6.7 g/dL (ref 6.5–8.1)

## 2022-12-30 LAB — CBC WITH DIFFERENTIAL/PLATELET
Abs Immature Granulocytes: 0.03 10*3/uL (ref 0.00–0.07)
Basophils Absolute: 0.1 10*3/uL (ref 0.0–0.1)
Basophils Relative: 1 %
Eosinophils Absolute: 0.2 10*3/uL (ref 0.0–0.5)
Eosinophils Relative: 3 %
HCT: 38.5 % (ref 36.0–46.0)
Hemoglobin: 12.6 g/dL (ref 12.0–15.0)
Immature Granulocytes: 0 %
Lymphocytes Relative: 33 %
Lymphs Abs: 2.5 10*3/uL (ref 0.7–4.0)
MCH: 28 pg (ref 26.0–34.0)
MCHC: 32.7 g/dL (ref 30.0–36.0)
MCV: 85.6 fL (ref 80.0–100.0)
Monocytes Absolute: 0.6 10*3/uL (ref 0.1–1.0)
Monocytes Relative: 8 %
Neutro Abs: 4.1 10*3/uL (ref 1.7–7.7)
Neutrophils Relative %: 55 %
Platelets: 194 10*3/uL (ref 150–400)
RBC: 4.5 MIL/uL (ref 3.87–5.11)
RDW: 13.2 % (ref 11.5–15.5)
WBC: 7.5 10*3/uL (ref 4.0–10.5)
nRBC: 0 % (ref 0.0–0.2)

## 2022-12-30 LAB — POCT URINALYSIS DIP (MANUAL ENTRY)
Bilirubin, UA: NEGATIVE
Glucose, UA: NEGATIVE mg/dL
Leukocytes, UA: NEGATIVE
Nitrite, UA: NEGATIVE
Protein Ur, POC: NEGATIVE mg/dL
Spec Grav, UA: 1.02 (ref 1.010–1.025)
Urobilinogen, UA: 0.2 E.U./dL
pH, UA: 6.5 (ref 5.0–8.0)

## 2022-12-30 LAB — LIPASE, BLOOD: Lipase: 26 U/L (ref 11–51)

## 2022-12-30 NOTE — Discharge Instructions (Signed)
You have been seen today for abdominal pain. Your evaluation was not suggestive of any emergent condition requiring medical intervention at this time. However, some abdominal problems make take more time to appear. Therefore, it is very important for you to pay attention to any new symptoms or worsening of your current condition.  Please return here or to the Emergency Department immediately should you begin to feel worse in any way or have any of the following symptoms: increasing or different abdominal pain, vomiting, inability to drink fluids, fever, or shaking chills.   You have had labs (blood tests) sent today. We will call you with any significant abnormalities or if there is need to begin or change treatment or pursue further follow up.  You may also review your test results online through MyChart. If you do not have a MyChart account, instructions to sign up should be on your discharge paperwork.

## 2022-12-30 NOTE — ED Triage Notes (Signed)
Here from right side and left side  abdominal pain x 2 days.  Denies any fever or diarrhea. Last bowel movement was yesterday.

## 2022-12-31 ENCOUNTER — Telehealth (HOSPITAL_COMMUNITY): Payer: Self-pay | Admitting: Emergency Medicine

## 2022-12-31 NOTE — Telephone Encounter (Signed)
Patient left voicemail asking for result review.  Reviewed all WNL results and to f/u with PCP if symptoms persist.  PAtient verbalized understanding

## 2023-01-01 NOTE — ED Provider Notes (Signed)
[facility]   [id number] 12/30/22 Arrival Time: 1127  ASSESSMENT & PLAN:  1. Right lower quadrant abdominal pain    No significant lab abnormalities to explain her symptoms. Ques ovarian related. Discussed. Declines ED evaluation for imaging.  Prefers home observation and PCP f/u.  Results for orders placed or performed during the hospital encounter of 12/30/22  CBC with Differential/Platelet  Result Value Ref Range   WBC 7.5 4.0 - 10.5 K/uL   RBC 4.50 3.87 - 5.11 MIL/uL   Hemoglobin 12.6 12.0 - 15.0 g/dL   HCT 78.2 95.6 - 21.3 %   MCV 85.6 80.0 - 100.0 fL   MCH 28.0 26.0 - 34.0 pg   MCHC 32.7 30.0 - 36.0 g/dL   RDW 08.6 57.8 - 46.9 %   Platelets 194 150 - 400 K/uL   nRBC 0.0 0.0 - 0.2 %   Neutrophils Relative % 55 %   Neutro Abs 4.1 1.7 - 7.7 K/uL   Lymphocytes Relative 33 %   Lymphs Abs 2.5 0.7 - 4.0 K/uL   Monocytes Relative 8 %   Monocytes Absolute 0.6 0.1 - 1.0 K/uL   Eosinophils Relative 3 %   Eosinophils Absolute 0.2 0.0 - 0.5 K/uL   Basophils Relative 1 %   Basophils Absolute 0.1 0.0 - 0.1 K/uL   Immature Granulocytes 0 %   Abs Immature Granulocytes 0.03 0.00 - 0.07 K/uL  Lipase, blood  Result Value Ref Range   Lipase 26 11 - 51 U/L  Comprehensive metabolic panel  Result Value Ref Range   Sodium 137 135 - 145 mmol/L   Potassium 4.1 3.5 - 5.1 mmol/L   Chloride 103 98 - 111 mmol/L   CO2 24 22 - 32 mmol/L   Glucose, Bld 85 70 - 99 mg/dL   BUN 16 6 - 20 mg/dL   Creatinine, Ser 6.29 0.44 - 1.00 mg/dL   Calcium 9.2 8.9 - 52.8 mg/dL   Total Protein 6.7 6.5 - 8.1 g/dL   Albumin 3.6 3.5 - 5.0 g/dL   AST 18 15 - 41 U/L   ALT 18 0 - 44 U/L   Alkaline Phosphatase 56 38 - 126 U/L   Total Bilirubin 0.4 0.3 - 1.2 mg/dL   GFR, Estimated >41 >32 mL/min   Anion gap 10 5 - 15  POC urinalysis dipstick  Result Value Ref Range   Color, UA yellow yellow   Clarity, UA clear clear   Glucose, UA negative negative mg/dL   Bilirubin, UA negative negative    Ketones, POC UA small (15) (A) negative mg/dL   Spec Grav, UA 4.401 0.272 - 1.025   Blood, UA trace-intact (A) negative   pH, UA 6.5 5.0 - 8.0   Protein Ur, POC negative negative mg/dL   Urobilinogen, UA 0.2 0.2 or 1.0 E.U./dL   Nitrite, UA Negative Negative   Leukocytes, UA Negative Negative      Discharge Instructions      You have been seen today for abdominal pain. Your evaluation was not suggestive of any emergent condition requiring medical intervention at this time. However, some abdominal problems make take more time to appear. Therefore, it is very important for you to pay attention to any new symptoms or worsening of your current condition.  Please return here or to the Emergency Department immediately should you begin to feel worse in any way or have any of the following symptoms: increasing or different abdominal pain, vomiting, inability to drink fluids, fever, or  shaking chills.   You have had labs (blood tests) sent today. We will call you with any significant abnormalities or if there is need to begin or change treatment or pursue further follow up.  You may also review your test results online through MyChart. If you do not have a MyChart account, instructions to sign up should be on your discharge paperwork.     Follow-up Information     [facility].   Specialty: Emergency Medicine Why: If symptoms worsen in any way. Contact information: [address] [place] [address] [phone]               Reviewed expectations re: course of current medical issues. Questions answered. Outlined signs and symptoms indicating need for more acute intervention. Patient verbalized understanding. After Visit Summary given.   SUBJECTIVE: History from: patient. Denise Fisher is a 52 y.o. female who presents with complaint of non-radiating upper (R>L) abdominal pain; gradual onset 2 d ago; stable.  Denies fever. Denies emesis/diarrhea. Slightly decreased appetite. No specific aggravating or alleviating factors reported. Normal non-bloody BM yesterday. Denies urinary symptoms.  Patient's last menstrual period was 12/16/2022.  No tx PTA.  Past Surgical History:  Procedure Laterality Date   CESAREAN SECTION     LITHOTRIPSY  09/2010   OBJECTIVE:  Vitals:   12/30/22 1245  BP: 129/79  Pulse: 86  Resp: 16  Temp: 98.1 F (36.7 C)  TempSrc: Oral  SpO2: 97%    General appearance: alert, oriented, no acute distress HEENT: [place]; AT; oropharynx moist Lungs: unlabored respirations Abdomen: soft; without distention; poorly localized mild tenderness to palpation over RUQ; normal bowel sounds; without masses or organomegaly; without guarding or rebound tenderness Back: without reported CVA tenderness; FROM at waist Extremities: without LE edema; symmetrical; without gross deformities Skin: warm and dry Neurologic: normal gait Psychological: alert and cooperative; normal mood and affect   Imaging: No results found.   Allergies  Allergen Reactions   Sudafed [Pseudoephedrine Hcl] Shortness Of Breath   Ciprocin-Fluocin-Procin [Fluocinolone Acetonide]    Red Dye Hives   Penicillins Rash   Sulfa Drugs Cross Reactors Rash                                               Past Medical History:  Diagnosis Date   Acne    Allergy    RHINTIS   Anxiety    Hydronephrosis 10/05/2010   Low HDL (under 40)    Obesity    Renal stone     Social History   Socioeconomic History   Marital status: Divorced    Spouse name: Not on file   Number of children: Not on file   Years of education: Not on file   Highest education level: Not on file  Occupational History   Not on file  Tobacco Use   Smoking status: Never   Smokeless tobacco: Never  Substance and Sexual Activity   Alcohol use: Yes    Alcohol/week: 2.0 standard drinks of alcohol    Types: 2 drink(s) per week   Drug use: No    Sexual activity: Not on file  Other Topics Concern   Not on file  Social History Narrative   Not on file   Social Determinants of Health   Financial Resource Strain: Not on file  Food Insecurity: Low Risk  (12/18/2022)   Received from [facility]   Food vital sign    Within the past 12 months, you worried that your food would run out before you got money to buy more: Never true    Within the past 12 months, the food you bought just didn't last and you didn't have money to get more. : Never true  Transportation Needs: Not on file (12/18/2022)  Physical Activity: Not on file  Stress: Not on file  Social Connections: Not on file  Intimate Partner Violence: Not on file    Family History  Problem Relation Age of Onset   Asthma Father    Asthma Brother    Diabetes Maternal Denise Sellar, MD 01/01/23 1355

## 2023-08-12 ENCOUNTER — Encounter: Payer: Self-pay | Admitting: Internal Medicine

## 2023-12-04 DIAGNOSIS — Z0000 Encounter for general adult medical examination without abnormal findings: Secondary | ICD-10-CM | POA: Diagnosis not present
